# Patient Record
Sex: Male | Born: 1955 | Race: White | Hispanic: No | Marital: Married | Smoking: Never smoker
Health system: Southern US, Community
[De-identification: ages and names within clinical notes are randomized; demographics above are authoritative.]

## PROBLEM LIST (undated history)

## (undated) DIAGNOSIS — K353 Acute appendicitis with localized peritonitis, without perforation or gangrene: Secondary | ICD-10-CM

## (undated) DIAGNOSIS — K648 Other hemorrhoids: Secondary | ICD-10-CM

## (undated) DIAGNOSIS — K625 Hemorrhage of anus and rectum: Secondary | ICD-10-CM

## (undated) DIAGNOSIS — K921 Melena: Secondary | ICD-10-CM

## (undated) DIAGNOSIS — E785 Hyperlipidemia, unspecified: Secondary | ICD-10-CM

## (undated) DIAGNOSIS — T7840XA Allergy, unspecified, initial encounter: Secondary | ICD-10-CM

## (undated) HISTORY — DX: Hemorrhage of anus and rectum: K62.5

## (undated) HISTORY — DX: Hyperlipidemia, unspecified: E78.5

## (undated) HISTORY — DX: Melena: K92.1

## (undated) HISTORY — DX: Acute appendicitis with localized peritonitis, without perforation or gangrene: K35.30

## (undated) HISTORY — DX: Other hemorrhoids: K64.8

## (undated) HISTORY — DX: Allergy, unspecified, initial encounter: T78.40XA

---

## 2004-11-01 ENCOUNTER — Ambulatory Visit: Payer: Self-pay | Admitting: Internal Medicine

## 2005-10-16 ENCOUNTER — Ambulatory Visit: Payer: Self-pay | Admitting: Internal Medicine

## 2006-09-09 ENCOUNTER — Ambulatory Visit: Payer: Self-pay | Admitting: Internal Medicine

## 2006-10-14 ENCOUNTER — Ambulatory Visit: Payer: Self-pay | Admitting: Internal Medicine

## 2010-09-21 NOTE — Assessment & Plan Note (Signed)
[facility]                         GASTROENTEROLOGY OFFICE NOTE   NAME:Davtyan, NICKSON MIDDLESWORTH                     MRN:          [id number]  DATE:09/09/2006                            DOB:          [birth year]    CHIEF COMPLAINT:  Hemorrhoids, follow up polyp.   Mr. Cubbage returns, last seen October 16, 2005.  His problems are:  1. Prolapsing, recurrent hemorrhoids, I think grade III.  2. Adenomatous colon polyp of the appendix, removed with cold biopsy      forceps versus biopsied (unsure of complete removal).  3. Allergies.   MEDICATIONS:  Claritin and Advair p.r.n.   DRUG ALLERGIES:  None known.   He is still having occasional problems with hemorrhoids, but he is  trying to modify diet and keep his bowels regular and not strain.  There  is some bleeding and protrusion and prolapse at times.  He is not really  using hydrocortisone suppositories any more.   He is not a smoker.  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   PHYSICAL EXAMINATION:  VITAL SIGNS:  Weight 176 pounds, pulse 80, blood  pressure 100/60.  RECTAL:  Inspection of the anal area shows some skin tag-like lesions  consistent with old external hemorrhoids, but there are also some mildly  prolapsed hemorrhoids protruding from the anal canal consistent with  prolapsing internal hemorrhoids.  Digital exam is not performed, and I  did not try to reduce these.  They appear to be grade III hemorrhoids at  this point given the prolapse, and that is consistent with previous  exams where I think it was worse, actually.   ASSESSMENT AND PLAN:  1. History of colon polyps.  A surveillance colonoscopy is indicated.      He is a little overdue from the 3 years I recommended, but I wanted      to bring him back at that timetable because I was not sure that I      completely removed the polyp.  In fact, I thought it was probably a      lymphoid follicle at the time.  2. Schedule surgical  appointment for evaluation of his hemorrhoids.      We have talked about the various methods, including my ability to      provide band ligation, but I think given the problems he has had, I      am not convinced that would be a durable procedure.  I would like      him to see a surgeon who has more options at his disposal with      respect to hemorrhoids,      and I really think a PPH procedure might be in his best interest      but would defer to the patient and surgical opinion.  I will ask      him to see Timothy E. Earlene Plater, M.D.     Iva Boop, MD,FACG  Electronically Signed    CEG/MedQ  DD: 09/09/2006  DT: 09/10/2006  Job #: [id number]   cc:   Mosetta Putt,  M.D.  Sheppard Plumber. Earlene Plater, M.D.

## 2011-06-25 ENCOUNTER — Ambulatory Visit (INDEPENDENT_AMBULATORY_CARE_PROVIDER_SITE_OTHER): Payer: Self-pay | Admitting: General Surgery

## 2011-06-25 ENCOUNTER — Encounter (INDEPENDENT_AMBULATORY_CARE_PROVIDER_SITE_OTHER): Payer: Self-pay | Admitting: General Surgery

## 2011-06-25 VITALS — BP 110/70 | HR 60 | Temp 98.4°F | Ht 71.0 in | Wt 171.6 lb

## 2011-06-25 DIAGNOSIS — K648 Other hemorrhoids: Secondary | ICD-10-CM

## 2011-06-25 NOTE — Patient Instructions (Signed)
Avoid straining if you can.  Go to the emergency room if you have heavy bleeding.

## 2011-06-25 NOTE — Progress Notes (Signed)
Patient ID: Brett Pace, male   DOB: 1955-10-17, 56 y.o.   MRN: [id number]  Chief Complaint  Patient presents with  . Rectal Problems    eval hems    HPI Brett Pace is a 56 y.o. male.   HPI  He is self-referred. He has a history of prolapsing internal hemorrhoids. He underwent rubber band ligation by Dr. Earlene Plater many years ago. He does a lot of heavy lifting at work. Recently he has had recurrent prolapse and some rectal bleeding. He feels it is from his internal hemorrhoids and presents here for evaluation.  Past Medical History  Diagnosis Date  . Asthma   . Rectal bleeding   . Blood in stool   . Prolapsed hemorrhoids     History reviewed. No pertinent past surgical history.  History reviewed. No pertinent family history.  Social History History  Substance Use Topics  . Smoking status: Never Smoker   . Smokeless tobacco: Not on file  . Alcohol Use: No    No Known Allergies  No current outpatient prescriptions on file.    Review of Systems Review of Systems  Constitutional: Negative.   Gastrointestinal: Negative for abdominal distention.       Per hpi.    Blood pressure 110/70, pulse 60, temperature 98.4 F (36.9 C), temperature source Temporal, height 5\' 11"  (1.803 m), weight 171 lb 9.6 oz (77.837 kg), SpO2 98.00%.  Physical Exam Physical Exam  Constitutional: He appears well-developed and well-nourished. No distress.  Genitourinary:       External hemorrhoids noted right anterior, right posterior, left lateral. These are soft and nontender.  Prolapsing internal hemorrhoid noted right anterior. This appears inflamed and friable.  Digital rectal exam no anal masses or blood.  Anoscopy-swollen inflamed right anterior internal hemorrhoid. Rubber band ligation this was performed and he tolerated it well.    Data Reviewed none  Assessment    Bleeding prolapsing internal hemorrhoid right anterior area. Rubber band ligation was performed.    Plan    Avoid heavy lifting or straining as much as possible. Wear a pad in his underwear. If he has heavy bleeding from the area go directly to the emergency department. Return visit 2 weeks.       Jianni Batten J 06/25/2011, 5:40 PM

## 2011-06-28 ENCOUNTER — Telehealth (INDEPENDENT_AMBULATORY_CARE_PROVIDER_SITE_OTHER): Payer: Self-pay

## 2011-06-28 NOTE — Telephone Encounter (Signed)
Pts wife calling to see if pt could take advil if needed for rectal pain. Pt had banding done 3 days ago and still has some mild rectal discomfort. Pt takes no meds and has not allergies. Pts wife advised pt can take advil and follow recommendations on bottle. To have pt call if discomfort does not resolve.

## 2011-09-02 ENCOUNTER — Encounter: Payer: Self-pay | Admitting: Internal Medicine

## 2012-03-19 ENCOUNTER — Ambulatory Visit (INDEPENDENT_AMBULATORY_CARE_PROVIDER_SITE_OTHER): Payer: Self-pay | Admitting: General Surgery

## 2012-03-19 ENCOUNTER — Encounter (INDEPENDENT_AMBULATORY_CARE_PROVIDER_SITE_OTHER): Payer: Self-pay | Admitting: General Surgery

## 2012-03-19 VITALS — BP 122/80 | HR 72 | Temp 96.9°F | Ht 71.0 in | Wt 172.0 lb

## 2012-03-19 DIAGNOSIS — K648 Other hemorrhoids: Secondary | ICD-10-CM

## 2012-03-19 MED ORDER — PRAMOXINE HCL 1 % RE FOAM: RECTAL | Status: DC | PRN

## 2012-03-19 NOTE — Progress Notes (Signed)
Subjective:     Patient ID: Brett Pace, male   DOB: 01/29/1956, 56 y.o.   MRN: [id number]  HPI  He is here because he is having some rectal bleeding again. He did well after the rubber band treatment in February of this year. About 3 weeks ago he began having intermittent rectal bleeding. He's also had some very loose frequent BMs. He is here to have this evaluated.   Review of Systems     Objective:   Physical Exam Gen.-he looks well and is in no acute distress.  Anorectal-small external hemorrhoids are noted. No masses on digital rectal exam. Anoscopy demonstrates moderate size internal hemorrhoids right anterior and right posterior positions. I attempted rubber band ligation of the right anterior hemorrhoid but could not get the rubber band to stay on the hemorrhoid. He was uncomfortable at this time so the procedure was aborted.    Assessment:     Bleeding internal hemorrhoids-rubber band ligation unable to be done.    Plan:     Proctofoam. If after a month He is still having bleeding problems then I asked him to call back for a follow-up visit.

## 2012-03-19 NOTE — Patient Instructions (Signed)
Use the medication as directed.

## 2012-08-05 ENCOUNTER — Encounter: Payer: Self-pay | Admitting: Internal Medicine

## 2013-02-03 ENCOUNTER — Encounter (INDEPENDENT_AMBULATORY_CARE_PROVIDER_SITE_OTHER): Payer: Self-pay | Admitting: General Surgery

## 2013-02-03 ENCOUNTER — Ambulatory Visit (INDEPENDENT_AMBULATORY_CARE_PROVIDER_SITE_OTHER): Payer: Self-pay | Admitting: General Surgery

## 2013-02-03 VITALS — BP 110/68 | HR 66 | Temp 97.3°F | Resp 14 | Ht 71.0 in | Wt 167.2 lb

## 2013-02-03 DIAGNOSIS — K644 Residual hemorrhoidal skin tags: Secondary | ICD-10-CM

## 2013-02-03 MED ORDER — HYDROCORTISONE 2.5 % RE CREA: TOPICAL_CREAM | Freq: Two times a day (BID) | RECTAL | Status: DC

## 2013-02-03 NOTE — Progress Notes (Signed)
Subjective:     Patient ID: Brett Pace, male   DOB: 01/11/1956, 57 y.o.   MRN: [id number]  HPI  He is well known to me because of his problems with bleeding internal hemorrhoids. This is better. However, he complains of having some anal discomfort after having a bowel movement especially after he is constipated. Certain foods make him constipated. By the next morning he is feeling better. This does not occur with every bowel movement.  No bleeding.   Review of Systems     Objective:   Physical Exam Gen.-he looks well and is in no acute distress.  Anorectal-external swellings are present and are of moderate size in the right anterior and right posterior positions. Small external swelling in the left lateral position. No fissure. Digital rectal exam no masses are felt.  Anoscopy demonstrates moderate size internal hemorrhoids in the right anterior and posterior positions with smaller internal hemorrhoid in the left lateral position.    Assessment:     Symptomatic external hemorrhoids. We discussed options including medical treatment, change in diet, versus surgical treatment. He prefers to try to medical treatment.     Plan:     We'll give him some Anusol HC cream to apply to the external hemorrhoids as needed. I've instructed him to increase the fiber in his diet by way of increasing raw fruits, raw vegetables, and grains. Return visit if this does not help him.

## 2013-02-03 NOTE — Patient Instructions (Addendum)
Increase the amount of raw fruits, raw vegetables, and grains in your diet.  Avoid constipation and take Milk of Magnesia or Miralax if you are having problems with constipation.  Use the hemorrhoid cream as needed.  Return if these things do not help.

## 2014-12-14 ENCOUNTER — Encounter: Payer: Self-pay | Admitting: Internal Medicine

## 2016-07-06 ENCOUNTER — Encounter (HOSPITAL_COMMUNITY)
Admission: EM | Disposition: A | Discharge status: Home / Self Care | Payer: Self-pay | Source: Home / Self Care | Attending: Emergency Medicine

## 2016-07-06 ENCOUNTER — Emergency Department (HOSPITAL_COMMUNITY): Payer: Self-pay | Admitting: Anesthesiology

## 2016-07-06 ENCOUNTER — Observation Stay (HOSPITAL_COMMUNITY)
Admission: EM | Admit: 2016-07-06 | Discharge: 2016-07-07 | Disposition: A | Discharge status: Home / Self Care | Payer: Self-pay | Attending: General Surgery | Admitting: General Surgery

## 2016-07-06 ENCOUNTER — Emergency Department (HOSPITAL_COMMUNITY): Payer: Self-pay

## 2016-07-06 ENCOUNTER — Encounter (HOSPITAL_COMMUNITY): Payer: Self-pay | Admitting: *Deleted

## 2016-07-06 DIAGNOSIS — K3580 Unspecified acute appendicitis: Principal | ICD-10-CM | POA: Diagnosis present

## 2016-07-06 DIAGNOSIS — K353 Acute appendicitis with localized peritonitis, without perforation or gangrene: Secondary | ICD-10-CM

## 2016-07-06 HISTORY — PX: LAPAROSCOPIC APPENDECTOMY: SHX408

## 2016-07-06 LAB — URINALYSIS, ROUTINE W REFLEX MICROSCOPIC
BILIRUBIN URINE: NEGATIVE
GLUCOSE, UA: NEGATIVE mg/dL
HGB URINE DIPSTICK: NEGATIVE
KETONES UR: 20 mg/dL — AB
Leukocytes, UA: NEGATIVE
Nitrite: NEGATIVE
PH: 7 (ref 5.0–8.0)
Protein, ur: NEGATIVE mg/dL
Specific Gravity, Urine: 1.023 (ref 1.005–1.030)

## 2016-07-06 LAB — COMPREHENSIVE METABOLIC PANEL
ALBUMIN: 4.1 g/dL (ref 3.5–5.0)
ALK PHOS: 92 U/L (ref 38–126)
ALT: 17 U/L (ref 17–63)
AST: 18 U/L (ref 15–41)
Anion gap: 9 (ref 5–15)
BILIRUBIN TOTAL: 1.1 mg/dL (ref 0.3–1.2)
BUN: 14 mg/dL (ref 6–20)
CO2: 25 mmol/L (ref 22–32)
Calcium: 8.9 mg/dL (ref 8.9–10.3)
Chloride: 101 mmol/L (ref 101–111)
Creatinine, Ser: 0.94 mg/dL (ref 0.61–1.24)
GFR calc Af Amer: >60 mL/min (ref >60)
GFR calc non Af Amer: >60 mL/min (ref >60)
GLUCOSE: 130 mg/dL — AB (ref 65–99)
POTASSIUM: 3.9 mmol/L (ref 3.5–5.1)
Sodium: 135 mmol/L (ref 135–145)
TOTAL PROTEIN: 7.3 g/dL (ref 6.5–8.1)

## 2016-07-06 LAB — CBC
HEMATOCRIT: 45.6 % (ref 39.0–52.0)
Hemoglobin: 15.6 g/dL (ref 13.0–17.0)
MCH: 30.3 pg (ref 26.0–34.0)
MCHC: 34.2 g/dL (ref 30.0–36.0)
MCV: 88.5 fL (ref 78.0–100.0)
Platelets: 219 10*3/uL (ref 150–400)
RBC: 5.15 MIL/uL (ref 4.22–5.81)
RDW: 12.4 % (ref 11.5–15.5)
WBC: 17.5 10*3/uL — ABNORMAL HIGH (ref 4.0–10.5)

## 2016-07-06 LAB — I-STAT TROPONIN, ED: TROPONIN I, POC: 0 ng/mL (ref 0.00–0.08)

## 2016-07-06 LAB — LIPASE, BLOOD: Lipase: 11 U/L (ref 11–51)

## 2016-07-06 SURGERY — APPENDECTOMY, LAPAROSCOPIC
Anesthesia: General

## 2016-07-06 MED ORDER — SUCCINYLCHOLINE CHLORIDE 20 MG/ML IJ SOLN
INTRAMUSCULAR | Status: AC
  Filled 2016-07-06: qty 1

## 2016-07-06 MED ORDER — GLYCOPYRROLATE 0.2 MG/ML IJ SOLN
INTRAMUSCULAR | Status: AC
  Filled 2016-07-06: qty 3

## 2016-07-06 MED ORDER — BUPIVACAINE HCL (PF) 0.5 % IJ SOLN
INTRAMUSCULAR | Status: AC
  Filled 2016-07-06: qty 30

## 2016-07-06 MED ORDER — SODIUM CHLORIDE 0.9 % IJ SOLN
INTRAMUSCULAR | Status: AC
  Filled 2016-07-06: qty 10

## 2016-07-06 MED ORDER — ERTAPENEM SODIUM 1 G IJ SOLR
1.0000 g | INTRAMUSCULAR | Status: DC
  Administered 2016-07-07: 1 g via INTRAVENOUS
  Filled 2016-07-06 (×2): qty 1

## 2016-07-06 MED ORDER — CHLORHEXIDINE GLUCONATE CLOTH 2 % EX PADS: 6.0000 | MEDICATED_PAD | Freq: Once | CUTANEOUS | Status: DC

## 2016-07-06 MED ORDER — NEOSTIGMINE METHYLSULFATE 10 MG/10ML IV SOLN
INTRAVENOUS | Status: DC | PRN
  Administered 2016-07-06: 4 mg via INTRAVENOUS

## 2016-07-06 MED ORDER — SIMETHICONE 80 MG PO CHEW: 40.0000 mg | CHEWABLE_TABLET | Freq: Four times a day (QID) | ORAL | Status: DC | PRN

## 2016-07-06 MED ORDER — LIDOCAINE HCL (CARDIAC) 10 MG/ML IV SOLN
INTRAVENOUS | Status: DC | PRN
  Administered 2016-07-06: 50 mg via INTRAVENOUS

## 2016-07-06 MED ORDER — FENTANYL CITRATE (PF) 250 MCG/5ML IJ SOLN
INTRAMUSCULAR | Status: AC
  Filled 2016-07-06: qty 5

## 2016-07-06 MED ORDER — SODIUM CHLORIDE 0.9 % IR SOLN
Status: DC | PRN
  Administered 2016-07-06: 1000 mL

## 2016-07-06 MED ORDER — MIDAZOLAM HCL 5 MG/5ML IJ SOLN
INTRAMUSCULAR | Status: DC | PRN
Start: 2016-07-06 — End: 2016-07-06
  Administered 2016-07-06: 2 mg via INTRAVENOUS

## 2016-07-06 MED ORDER — MORPHINE SULFATE (PF) 4 MG/ML IV SOLN
4.0000 mg | Freq: Once | INTRAVENOUS | Status: DC
  Filled 2016-07-06: qty 1

## 2016-07-06 MED ORDER — ROCURONIUM BROMIDE 50 MG/5ML IV SOLN
INTRAVENOUS | Status: AC
  Filled 2016-07-06: qty 1

## 2016-07-06 MED ORDER — BUPIVACAINE HCL (PF) 0.5 % IJ SOLN
INTRAMUSCULAR | Status: DC | PRN
  Administered 2016-07-06: 10 mL

## 2016-07-06 MED ORDER — ONDANSETRON 4 MG PO TBDP: 4.0000 mg | ORAL_TABLET | Freq: Four times a day (QID) | ORAL | Status: DC | PRN

## 2016-07-06 MED ORDER — PROPOFOL 10 MG/ML IV BOLUS
INTRAVENOUS | Status: AC
  Filled 2016-07-06: qty 40

## 2016-07-06 MED ORDER — POVIDONE-IODINE 10 % EX OINT
TOPICAL_OINTMENT | CUTANEOUS | Status: AC
  Filled 2016-07-06: qty 1

## 2016-07-06 MED ORDER — ONDANSETRON HCL 4 MG/2ML IJ SOLN
4.0000 mg | Freq: Once | INTRAMUSCULAR | Status: DC
  Filled 2016-07-06: qty 2

## 2016-07-06 MED ORDER — SODIUM CHLORIDE 0.9 % IV SOLN
INTRAVENOUS | Status: DC | PRN
  Administered 2016-07-06: 15:00:00 via INTRAVENOUS

## 2016-07-06 MED ORDER — SODIUM CHLORIDE 0.9 % IV SOLN
INTRAVENOUS | Status: DC
  Administered 2016-07-06: 23:00:00 via INTRAVENOUS

## 2016-07-06 MED ORDER — MIDAZOLAM HCL 2 MG/2ML IJ SOLN
INTRAMUSCULAR | Status: AC
  Filled 2016-07-06: qty 2

## 2016-07-06 MED ORDER — GLYCOPYRROLATE 0.2 MG/ML IJ SOLN
INTRAMUSCULAR | Status: DC | PRN
  Administered 2016-07-06: 0.6 mg via INTRAVENOUS

## 2016-07-06 MED ORDER — OXYCODONE-ACETAMINOPHEN 5-325 MG PO TABS
1.0000 | ORAL_TABLET | ORAL | Status: DC | PRN
  Administered 2016-07-07 (×2): 2 via ORAL
  Filled 2016-07-06 (×2): qty 2

## 2016-07-06 MED ORDER — GLYCOPYRROLATE 0.2 MG/ML IJ SOLN
INTRAMUSCULAR | Status: AC
  Filled 2016-07-06: qty 1

## 2016-07-06 MED ORDER — DIPHENHYDRAMINE HCL 25 MG PO CAPS: 25.0000 mg | ORAL_CAPSULE | Freq: Four times a day (QID) | ORAL | Status: DC | PRN

## 2016-07-06 MED ORDER — LIDOCAINE HCL (PF) 1 % IJ SOLN
INTRAMUSCULAR | Status: AC
  Filled 2016-07-06: qty 5

## 2016-07-06 MED ORDER — ARTIFICIAL TEARS OP OINT
TOPICAL_OINTMENT | OPHTHALMIC | Status: AC
  Filled 2016-07-06: qty 3.5

## 2016-07-06 MED ORDER — ERTAPENEM SODIUM 1 G IJ SOLR
1.0000 g | Freq: Once | INTRAMUSCULAR | Status: AC
  Administered 2016-07-06: 1 g via INTRAVENOUS
  Filled 2016-07-06: qty 1

## 2016-07-06 MED ORDER — IOPAMIDOL (ISOVUE-300) INJECTION 61%
100.0000 mL | Freq: Once | INTRAVENOUS | Status: AC | PRN
  Administered 2016-07-06: 100 mL via INTRAVENOUS

## 2016-07-06 MED ORDER — POVIDONE-IODINE 10 % OINT PACKET
TOPICAL_OINTMENT | CUTANEOUS | Status: DC | PRN
  Administered 2016-07-06: 1 via TOPICAL

## 2016-07-06 MED ORDER — ONDANSETRON HCL 4 MG/2ML IJ SOLN: 4.0000 mg | Freq: Four times a day (QID) | INTRAMUSCULAR | Status: DC | PRN

## 2016-07-06 MED ORDER — ENOXAPARIN SODIUM 40 MG/0.4ML ~~LOC~~ SOLN
40.0000 mg | SUBCUTANEOUS | Status: DC
  Administered 2016-07-07: 40 mg via SUBCUTANEOUS
  Filled 2016-07-06: qty 0.4

## 2016-07-06 MED ORDER — PROPOFOL 10 MG/ML IV BOLUS
INTRAVENOUS | Status: DC | PRN
  Administered 2016-07-06: 150 mg via INTRAVENOUS

## 2016-07-06 MED ORDER — DIPHENHYDRAMINE HCL 50 MG/ML IJ SOLN: 25.0000 mg | Freq: Four times a day (QID) | INTRAMUSCULAR | Status: DC | PRN

## 2016-07-06 MED ORDER — PHENYLEPHRINE 40 MCG/ML (10ML) SYRINGE FOR IV PUSH (FOR BLOOD PRESSURE SUPPORT)
PREFILLED_SYRINGE | INTRAVENOUS | Status: AC
  Filled 2016-07-06: qty 10

## 2016-07-06 MED ORDER — SUCCINYLCHOLINE CHLORIDE 20 MG/ML IJ SOLN
INTRAMUSCULAR | Status: DC | PRN
  Administered 2016-07-06: 120 mg via INTRAVENOUS

## 2016-07-06 MED ORDER — SODIUM CHLORIDE 0.9 % IV BOLUS (SEPSIS)
1000.0000 mL | Freq: Once | INTRAVENOUS | Status: AC
  Administered 2016-07-06: 1000 mL via INTRAVENOUS

## 2016-07-06 MED ORDER — KETOROLAC TROMETHAMINE 30 MG/ML IJ SOLN
30.0000 mg | Freq: Once | INTRAMUSCULAR | Status: AC
Start: 2016-07-06 — End: 2016-07-06
  Administered 2016-07-06: 30 mg via INTRAVENOUS
  Filled 2016-07-06: qty 1

## 2016-07-06 MED ORDER — EPHEDRINE SULFATE 50 MG/ML IJ SOLN
INTRAMUSCULAR | Status: AC
  Filled 2016-07-06: qty 1

## 2016-07-06 MED ORDER — LORAZEPAM 2 MG/ML IJ SOLN: 1.0000 mg | INTRAMUSCULAR | Status: DC | PRN

## 2016-07-06 MED ORDER — NEOSTIGMINE METHYLSULFATE 10 MG/10ML IV SOLN
INTRAVENOUS | Status: AC
Start: 2016-07-06 — End: 2016-07-06
  Filled 2016-07-06: qty 1

## 2016-07-06 MED ORDER — ACETAMINOPHEN 650 MG RE SUPP: 650.0000 mg | Freq: Four times a day (QID) | RECTAL | Status: DC | PRN

## 2016-07-06 MED ORDER — SEVOFLURANE IN SOLN
RESPIRATORY_TRACT | Status: AC
  Filled 2016-07-06: qty 250

## 2016-07-06 MED ORDER — SODIUM CHLORIDE 0.9 % IJ SOLN
INTRAMUSCULAR | Status: AC
  Filled 2016-07-06: qty 20

## 2016-07-06 MED ORDER — ROCURONIUM BROMIDE 100 MG/10ML IV SOLN
INTRAVENOUS | Status: DC | PRN
  Administered 2016-07-06 (×2): 10 mg via INTRAVENOUS

## 2016-07-06 MED ORDER — ONDANSETRON HCL 4 MG/2ML IJ SOLN
INTRAMUSCULAR | Status: AC
  Filled 2016-07-06: qty 2

## 2016-07-06 MED ORDER — FENTANYL CITRATE (PF) 100 MCG/2ML IJ SOLN
INTRAMUSCULAR | Status: DC | PRN
  Administered 2016-07-06 (×3): 50 ug via INTRAVENOUS

## 2016-07-06 MED ORDER — LIDOCAINE HCL (PF) 1 % IJ SOLN
INTRAMUSCULAR | Status: AC
  Filled 2016-07-06: qty 10

## 2016-07-06 MED ORDER — HYDROMORPHONE HCL 1 MG/ML IJ SOLN
1.0000 mg | INTRAMUSCULAR | Status: DC | PRN
  Administered 2016-07-07: 1 mg via INTRAVENOUS
  Filled 2016-07-06 (×2): qty 1

## 2016-07-06 MED ORDER — ACETAMINOPHEN 325 MG PO TABS: 650.0000 mg | ORAL_TABLET | Freq: Four times a day (QID) | ORAL | Status: DC | PRN

## 2016-07-06 SURGICAL SUPPLY — 43 items
BAG HAMPER (MISCELLANEOUS) ×3 IMPLANT
CHLORAPREP W/TINT 26ML (MISCELLANEOUS) ×3 IMPLANT
CLOTH BEACON ORANGE TIMEOUT ST (SAFETY) ×3 IMPLANT
COVER LIGHT HANDLE STERIS (MISCELLANEOUS) ×6 IMPLANT
CUTTER FLEX LINEAR 45M (STAPLE) ×3 IMPLANT
DECANTER SPIKE VIAL GLASS SM (MISCELLANEOUS) ×3 IMPLANT
ELECT REM PT RETURN 9FT ADLT (ELECTROSURGICAL) ×3
ELECTRODE REM PT RTRN 9FT ADLT (ELECTROSURGICAL) ×1 IMPLANT
EVACUATOR SMOKE 8.L (FILTER) ×3 IMPLANT
FORMALIN 10 PREFIL 120ML (MISCELLANEOUS) ×3 IMPLANT
GLOVE BIOGEL PI IND STRL 7.0 (GLOVE) ×2 IMPLANT
GLOVE BIOGEL PI INDICATOR 7.0 (GLOVE) ×4
GLOVE SURG SS PI 7.5 STRL IVOR (GLOVE) ×3 IMPLANT
GOWN STRL REUS W/ TWL XL LVL3 (GOWN DISPOSABLE) ×1 IMPLANT
GOWN STRL REUS W/TWL LRG LVL3 (GOWN DISPOSABLE) ×3 IMPLANT
GOWN STRL REUS W/TWL XL LVL3 (GOWN DISPOSABLE) ×2
INST SET LAPROSCOPIC AP (KITS) ×3 IMPLANT
IV NS IRRIG 3000ML ARTHROMATIC (IV SOLUTION) IMPLANT
KIT ROOM TURNOVER APOR (KITS) ×3 IMPLANT
MANIFOLD NEPTUNE II (INSTRUMENTS) ×3 IMPLANT
NEEDLE INSUFFLATION 14GA 120MM (NEEDLE) ×3 IMPLANT
NS IRRIG 1000ML POUR BTL (IV SOLUTION) ×3 IMPLANT
PACK LAP CHOLE LZT030E (CUSTOM PROCEDURE TRAY) ×3 IMPLANT
PAD ARMBOARD 7.5X6 YLW CONV (MISCELLANEOUS) ×3 IMPLANT
PENCIL HANDSWITCHING (ELECTRODE) ×3 IMPLANT
POUCH SPECIMEN RETRIEVAL 10MM (ENDOMECHANICALS) ×3 IMPLANT
RELOAD 45 VASCULAR/THIN (ENDOMECHANICALS) IMPLANT
RELOAD STAPLE TA45 3.5 REG BLU (ENDOMECHANICALS) IMPLANT
SET BASIN LINEN APH (SET/KITS/TRAYS/PACK) ×3 IMPLANT
SET TUBE IRRIG SUCTION NO TIP (IRRIGATION / IRRIGATOR) IMPLANT
SHEARS HARMONIC ACE PLUS 36CM (ENDOMECHANICALS) ×3 IMPLANT
SPONGE GAUZE 2X2 8PLY STER LF (GAUZE/BANDAGES/DRESSINGS) ×1
SPONGE GAUZE 2X2 8PLY STRL LF (GAUZE/BANDAGES/DRESSINGS) ×2 IMPLANT
STAPLER VISISTAT (STAPLE) ×3 IMPLANT
SUT VICRYL 0 UR6 27IN ABS (SUTURE) ×3 IMPLANT
TAPE CLOTH SURG 4X10 WHT LF (GAUZE/BANDAGES/DRESSINGS) ×3 IMPLANT
TRAY FOLEY CATH SILVER 16FR (SET/KITS/TRAYS/PACK) ×3 IMPLANT
TROCAR ENDO BLADELESS 11MM (ENDOMECHANICALS) ×3 IMPLANT
TROCAR ENDO BLADELESS 12MM (ENDOMECHANICALS) ×3 IMPLANT
TROCAR XCEL NON-BLD 5MMX100MML (ENDOMECHANICALS) ×3 IMPLANT
TUBING INSUFFLATION (TUBING) ×3 IMPLANT
WARMER LAPAROSCOPE (MISCELLANEOUS) ×3 IMPLANT
YANKAUER SUCT 12FT TUBE ARGYLE (SUCTIONS) ×3 IMPLANT

## 2016-07-06 NOTE — ED Notes (Signed)
Pt and family aware of NPO status.

## 2016-07-06 NOTE — Addendum Note (Signed)
Addendum  created 07/06/16 1715 by Earleen NewportAmy A Robbi Spells, CRNA   Anesthesia Intra Meds edited

## 2016-07-06 NOTE — Anesthesia Postprocedure Evaluation (Signed)
Anesthesia Post Note  Patient: Sharlene DoryHenry J Winsett  Procedure(s) Performed: Procedure(s) (LRB): APPENDECTOMY LAPAROSCOPIC (N/A)  Patient location during evaluation: PACU Anesthesia Type: General Level of consciousness: awake and alert and oriented Pain management: pain level controlled Vital Signs Assessment: post-procedure vital signs reviewed and stable Respiratory status: spontaneous breathing Cardiovascular status: stable Postop Assessment: no signs of nausea or vomiting Anesthetic complications: no     Last Vitals:  Vitals:   07/06/16 1645 07/06/16 1700  BP: 107/78 123/77  Pulse: 95   Resp: 19 17  Temp: 37 C     Last Pain:  Vitals:   07/06/16 1003  TempSrc:   PainSc: 2                  ADAMS, AMY A

## 2016-07-06 NOTE — Op Note (Signed)
Patient:  Brett Pace  DOB:  29-Jun-1955  MRN:  [id number]   Preop Diagnosis:  Acute appendicitis  Postop Diagnosis:  Same  Procedure:  Laparoscopic appendectomy  Surgeon:  Franky MachoMark Tommye Lehenbauer, M.D.  Anes:  Gen. endotracheal  Indications:  Patient is a 61 year old white male who presents with a 24-hour history of worsening right lower quadrant abdominal pain. CT scan of the abdomen reveals acute appendicitis. The risks and benefits of the procedure including bleeding, infection, and the possibility of an open procedure were fully explained to the patient, who gave informed consent.  Procedure note:  The patient was placed in the supine position. After induction of general endotracheal anesthesia, the abdomen was prepped and draped using usual sterile technique with DuraPrep. Surgical site confirmation was performed.  A supraumbilical incision was made down to the fascia. A Veress needle was introduced into the abdominal cavity and confirmation of placement was done using the saline drop test. The abdomen was then insufflated to 16 mmHg pressure. An 11 mm trocar was introduced into the abdominal cavity under direct visualization without difficulty. The patient was placed in deeper Trendelenburg position and an additional 12 mm trocar was placed in the suprapubic region and a 5 mm trocar was placed in the left lower quadrant region. The appendix was visualized and noted to be acutely inflamed. The mesoappendix was divided using the Harmonic scalpel. A vascular Endo GIA was placed across the base the appendix and fired. The appendix was then removed using an Endo Catch bag. The staple line was inspected and noted to be within normal limits. All fluid and air were then evacuated from the abdominal cavity prior to the removal of the trochars.  All wounds were irrigated with normal saline. All wounds were injected with 0.5% Sensorcaine. The supraumbilical fascia as well as suprapubic fascia were  reapproximated using 0 Vicryl interrupted sutures. All skin incisions were closed using staples. Betadine ointment and dry sterile dressings were applied.  All tape and needle counts were correct at the end the procedure. The patient was extubated in the operating room and transferred to PACU in stable condition.  Complications:  None  EBL:  Minimal  Specimen:  Appendix

## 2016-07-06 NOTE — Anesthesia Procedure Notes (Signed)
Procedure Name: Intubation Date/Time: 07/06/2016 3:52 PM Performed by: Pernell DupreADAMS, AMY A Pre-anesthesia Checklist: Patient identified, Patient being monitored, Timeout performed, Emergency Drugs available and Suction available Patient Re-evaluated:Patient Re-evaluated prior to inductionOxygen Delivery Method: Circle System Utilized Preoxygenation: Pre-oxygenation with 100% oxygen Intubation Type: IV induction, Rapid sequence and Cricoid Pressure applied Ventilation: Mask ventilation without difficulty Laryngoscope Size: Miller and 3 Grade View: Grade I Tube type: Oral Tube size: 7.0 mm Number of attempts: 1 Airway Equipment and Method: Stylet Placement Confirmation: ETT inserted through vocal cords under direct vision,  positive ETCO2 and breath sounds checked- equal and bilateral Secured at: 21 cm Tube secured with: Tape Dental Injury: Teeth and Oropharynx as per pre-operative assessment

## 2016-07-06 NOTE — ED Provider Notes (Signed)
[facility]-EMERGENCY DEPT Provider Note   CSN: [id number] Arrival date & time: 07/06/16  [id number]     History   Chief Complaint Chief Complaint  Patient presents with  . Abdominal Pain    HPI Brett Pace is a 61 y.o. male.  61 year old man with history of GERD, esophageal spasm, IBS-D, and asthma presents with abdominal pain. Abdominal pain started yesterday at noon as a pressure like sensation in the center of his abdomen that radiates to his epigastrium. The pressure sensation is worse when he sits up or moves around. Severity is 6 out of 10. The pain was initially only associated with belching and decreased appetite. His morning he woke up and felt lightheaded when he sat up in bed and developed shakiness, cold sweats and diarrhea. When he arrived to the ED he became nauseous and had nonbloody nonbilious vomited x1. This morning he also had burning with urination. He has history of IBS but diarrhea usually works relieve the pain, that did not happen this morning. He denies myalgia, difficulty breathing, chest pain or pressure, edema, heartburn or metallic taste. At baseline he can walk a mile without developing chest pain or shortness of breath. His HR is usually ~68.       Past Medical History:  Diagnosis Date  . Asthma   . Blood in stool   . Prolapsed hemorrhoids   . Rectal bleeding     Patient Active Problem List   Diagnosis Date Noted  . External hemorrhoids 02/03/2013  . Bleeding internal hemorrhoids 03/19/2012  . Internal prolapsed hemorrhoids 06/25/2011    History reviewed. No pertinent surgical history.     Home Medications    Prior to Admission medications   Medication Sig Start Date End Date Taking? Authorizing Provider  hydrocortisone (ANUSOL-HC) 2.5 % rectal cream Place rectally 2 (two) times daily. Apply around anus for irritated & painful hemorrhoids 02/03/13   Avel Peaceodd Rosenbower, MD  pramoxine (PROCTOFOAM) 1 % foam Place rectally every 4 (four) hours as needed  for hemorrhoids. 03/19/12   Avel Peaceodd Rosenbower, MD    Family History Family History  Problem Relation Age of Onset  . Cancer Mother     ovarian    Social History Social History  Substance Use Topics  . Smoking status: Never Smoker  . Smokeless tobacco: Never Used  . Alcohol use No     Allergies   Patient has no known allergies.   Review of Systems Review of Systems  Constitutional: Positive for appetite change and diaphoresis. Negative for chills and fever.  HENT: Negative for congestion and sore throat.   Respiratory: Negative for chest tightness, shortness of breath and wheezing.   Cardiovascular: Negative for chest pain and leg swelling.  Gastrointestinal: Positive for abdominal pain, diarrhea, nausea and vomiting. Negative for blood in stool.  Genitourinary: Positive for dysuria. Negative for frequency and urgency.  Musculoskeletal: Negative for myalgias.  Neurological: Positive for dizziness.  All other systems reviewed and are negative.    Physical Exam Updated Vital Signs BP 103/66 (BP Location: Right Arm)   Pulse (!) 51   Temp 97.7 F (36.5 C) (Oral)   Resp 18   Ht 5\' 10"  (1.778 m)   Wt 77.1 kg   SpO2 100%   BMI 24.39 kg/m   Physical Exam  Constitutional: He appears well-developed and well-nourished. No distress.  HENT:  Head: Normocephalic and atraumatic.  Cardiovascular: Regular rhythm.  Bradycardia present.   No murmur heard. Pulmonary/Chest: Effort normal. No respiratory distress. He  has no wheezes. He has no rales. He exhibits no tenderness.  Abdominal: Soft. Bowel sounds are normal. He exhibits no distension. There is no guarding.  RLQ tenderness   Neurological: He is alert.  Skin: Skin is warm and dry. He is not diaphoretic.     ED Treatments / Results  Labs (all labs ordered are listed, but only abnormal results are displayed) Labs Reviewed  URINALYSIS, ROUTINE W REFLEX MICROSCOPIC    EKG  EKG Interpretation None        Radiology No results found.  Procedures Procedures (including critical care time)  Medications Ordered in ED Medications - No data to display   Initial Impression / Assessment and Plan / ED Course  I have reviewed the triage vital signs and the nursing notes.  Pertinent labs & imaging results that were available during my care of the patient were reviewed by me and considered in my medical decision making (see chart for details).   41-year-old man with history of IBS-D, GERD, esophogeal spasm, and asthma presented with abdominal pain, nausea, diarrhea, decreased appetite and rigors. He was afebrile but found to have right lower quadrant tenderness on exam. CBC revealed leukocytosis 17. Troponin was negative. Lipase and CMP were within normal limits. Urinalysis did not show signs of infection. CT abdomen and pelvis revealed inflammation of the appendix with no evidence of perforation or abscess. Orthopedic surgery was consulted for evaluation. He was started on IV antibiotics and admitted.   Final Clinical Impressions(s) / ED Diagnoses   Final diagnoses:  None    New Prescriptions New Prescriptions   No medications on file     Eulah Pont, MD 07/06/16 1422    Eulah Pont, MD 07/06/16 1506    Blane Ohara, MD 07/06/16 [phone]

## 2016-07-06 NOTE — H&P (Signed)
Brett Pace is an 61 y.o. male.   Chief Complaint: Right lower quadrant abdominal pain HPI: Patient is a 61 year old white male who presented to the emergency room with a 24-hour history of worsening lower abdominal pain. He states it was crampy in nature. He did feel somewhat shaky this morning. He did belch. No diarrhea or constipation. CT scan of the abdomen reveals acute appendicitis. His pain appears to be 4 out of 10.  Past Medical History:  Diagnosis Date  . Asthma   . Blood in stool   . Prolapsed hemorrhoids   . Rectal bleeding     History reviewed. No pertinent surgical history.  Family History  Problem Relation Age of Onset  . Cancer Mother     ovarian   Social History:  reports that he has never smoked. He has never used smokeless tobacco. He reports that he does not drink alcohol or use drugs.  Allergies: No Known Allergies   (Not in a hospital admission)  Results for orders placed or performed during the hospital encounter of 07/06/16 (from the past 48 hour(s))  Comprehensive metabolic panel     Status: Abnormal   Collection Time: 07/06/16  9:57 AM  Result Value Ref Range   Sodium 135 135 - 145 mmol/L   Potassium 3.9 3.5 - 5.1 mmol/L   Chloride 101 101 - 111 mmol/L   CO2 25 22 - 32 mmol/L   Glucose, Bld 130 (H) 65 - 99 mg/dL   BUN 14 6 - 20 mg/dL   Creatinine, Ser 0.94 0.61 - 1.24 mg/dL   Calcium 8.9 8.9 - 10.3 mg/dL   Total Protein 7.3 6.5 - 8.1 g/dL   Albumin 4.1 3.5 - 5.0 g/dL   AST 18 15 - 41 U/L   ALT 17 17 - 63 U/L   Alkaline Phosphatase 92 38 - 126 U/L   Total Bilirubin 1.1 0.3 - 1.2 mg/dL   GFR calc non Af Amer >60 >60 mL/min   GFR calc Af Amer >60 >60 mL/min    Comment: (NOTE) The eGFR has been calculated using the CKD EPI equation. This calculation has not been validated in all clinical situations. eGFR's persistently <60 mL/min signify possible Chronic Kidney Disease.    Anion gap 9 5 - 15  CBC     Status: Abnormal   Collection Time:  07/06/16  9:57 AM  Result Value Ref Range   WBC 17.5 (H) 4.0 - 10.5 K/uL   RBC 5.15 4.22 - 5.81 MIL/uL   Hemoglobin 15.6 13.0 - 17.0 g/dL   HCT 45.6 39.0 - 52.0 %   MCV 88.5 78.0 - 100.0 fL   MCH 30.3 26.0 - 34.0 pg   MCHC 34.2 30.0 - 36.0 g/dL   RDW 12.4 11.5 - 15.5 %   Platelets 219 150 - 400 K/uL  Lipase, blood     Status: None   Collection Time: 07/06/16  9:57 AM  Result Value Ref Range   Lipase 11 11 - 51 U/L  Urinalysis, Routine w reflex microscopic     Status: Abnormal   Collection Time: 07/06/16 10:24 AM  Result Value Ref Range   Color, Urine YELLOW YELLOW   APPearance CLEAR CLEAR   Specific Gravity, Urine 1.023 1.005 - 1.030   pH 7.0 5.0 - 8.0   Glucose, UA NEGATIVE NEGATIVE mg/dL   Hgb urine dipstick NEGATIVE NEGATIVE   Bilirubin Urine NEGATIVE NEGATIVE   Ketones, ur 20 (A) NEGATIVE mg/dL   Protein, ur  NEGATIVE NEGATIVE mg/dL   Nitrite NEGATIVE NEGATIVE   Leukocytes, UA NEGATIVE NEGATIVE  I-stat troponin, ED     Status: None   Collection Time: 07/06/16 10:26 AM  Result Value Ref Range   Troponin i, poc 0.00 0.00 - 0.08 ng/mL   Comment 3            Comment: Due to the release kinetics of cTnI, a negative result within the first hours of the onset of symptoms does not rule out myocardial infarction with certainty. If myocardial infarction is still suspected, repeat the test at appropriate intervals.    Ct Abdomen Pelvis W Contrast  Result Date: 07/06/2016 CLINICAL DATA:  Right abdominal pain with vomiting. EXAM: CT ABDOMEN AND PELVIS WITH CONTRAST TECHNIQUE: Multidetector CT imaging of the abdomen and pelvis was performed using the standard protocol following bolus administration of intravenous contrast. CONTRAST:  155m ISOVUE-300 IOPAMIDOL (ISOVUE-300) INJECTION 61% COMPARISON:  None. FINDINGS: Lower chest: 0.3 cm pleural-based nodule in the left lower lobe on sequence 4, image 16 is probably an incidental finding but nonspecific. No pleural effusions.  Hepatobiliary: 0.4 cm hypodensity in the left hepatic lobe is too small to definitively characterize. Otherwise, normal appearance of the liver or. Portal venous system is patent. Normal appearance of the gallbladder without biliary dilatation. Pancreas: Normal appearance of the pancreas without inflammation or duct dilatation. Spleen: Normal appearance of spleen without enlargement. Adrenals/Urinary Tract: Normal adrenal glands. Normal appearance of the urinary bladder. Normal appearance of both kidneys without hydronephrosis or suspicious renal lesion. Stomach/Bowel: Inflammation surrounding the appendix. Appendix is mildly dilated measuring up to 1.2 cm on sequence 2, image 69. There is no evidence for free air or adjacent abscess in this area. There may be mild wall thickening involving an adjacent loop of small bowel on sequence 2, image 73. No evidence for a bowel obstruction. Vascular/Lymphatic: No significant vascular findings are present. No enlarged abdominal or pelvic lymph nodes. Reproductive: Prostate is unremarkable. Other: No free fluid.  No free air. Musculoskeletal: S1 appears to represent a transitional vertebra ready. There is posterior disc space narrowing at L5-S1. IMPRESSION: Acute appendicitis. Inflammation surrounding the appendix but no evidence for perforation or abscess. Tiny pleural-based nodule in left lower lobe is nonspecific. No follow-up needed if patient is low-risk. Non-contrast chest CT can be considered in 12 months if patient is high-risk. This recommendation follows the consensus statement: Guidelines for Management of Incidental Pulmonary Nodules Detected on CT Images: From the Fleischner Society 2017; Radiology 2017; 284:228-243. These results were called by telephone at the time of interpretation on 07/06/2016 at 1:02 pm to Dr. JElnora Morrison, who verbally acknowledged these results. Electronically Signed   By: AMarkus DaftM.D.   On: 07/06/2016 13:03    Review of Systems   Constitutional: Positive for malaise/fatigue.  HENT: Negative.   Eyes: Negative.   Respiratory: Negative.   Cardiovascular: Negative.   Gastrointestinal: Positive for abdominal pain. Negative for diarrhea and vomiting.  Genitourinary: Negative.   Musculoskeletal: Negative.   Skin: Negative.   Neurological: Negative.   Endo/Heme/Allergies: Negative.   Psychiatric/Behavioral: Negative.     Blood pressure 111/80, pulse 69, temperature 97.7 F (36.5 C), resp. rate 23, height '5\' 10"'$  (1.778 m), weight 170 lb (77.1 kg), SpO2 100 %. Physical Exam  Vitals reviewed. Constitutional: He is oriented to person, place, and time. He appears well-developed and well-nourished.  HENT:  Head: Normocephalic and atraumatic.  Neck: Normal range of motion. Neck supple.  Cardiovascular: Normal rate,  regular rhythm and normal heart sounds.   Respiratory: Effort normal and breath sounds normal.  GI: Soft. He exhibits no distension. There is tenderness. There is no rebound.  Tender in the right lower quadrant to palpation. No rigidity noted.  Neurological: He is alert and oriented to person, place, and time.  Skin: Skin is warm and dry.     Assessment/Plan Impression: Acute appendicitis Plan: Patient be taken to the operating room for laparoscopic appendectomy. Risks and benefits of the procedure including bleeding, infection, and the possibility of an open procedure were fully explained to the patient, who gave informed consent.  Aviva Signs, MD 07/06/2016, 3:03 PM

## 2016-07-06 NOTE — Transfer of Care (Signed)
Immediate Anesthesia Transfer of Care Note  Patient: Brett Pace  Procedure(s) Performed: Procedure(s): APPENDECTOMY LAPAROSCOPIC (N/A)  Patient Location: PACU  Anesthesia Type:General  Level of Consciousness: awake, oriented and patient cooperative  Airway & Oxygen Therapy: Patient Spontanous Breathing and Patient connected to face mask oxygen  Post-op Assessment: Report given to RN and Post -op Vital signs reviewed and stable  Post vital signs: Reviewed and stable  Last Vitals:  Vitals:   07/06/16 1500 07/06/16 1645  BP: 133/94 107/78  Pulse:  95  Resp: 14 19  Temp:  (P) 37 C    Last Pain:  Vitals:   07/06/16 1003  TempSrc:   PainSc: 2          Complications: No apparent anesthesia complications

## 2016-07-06 NOTE — ED Notes (Signed)
Pt left with OR team for procedure.

## 2016-07-06 NOTE — ED Triage Notes (Signed)
Pt comes in with pressure in abdomen that started after he ate yesterday. He has increased amount of gas. Pt denies any vomiting. He had one episode of diarrhea yesterday.

## 2016-07-06 NOTE — Anesthesia Preprocedure Evaluation (Signed)
Anesthesia Evaluation  Patient identified by MRN, date of birth, ID band Patient awake    Reviewed: Allergy & Precautions, NPO status , Patient's Chart, lab work & pertinent test results  Airway Mallampati: II  TM Distance: >3 FB Neck ROM: Full    Dental  (+) Teeth Intact   Pulmonary asthma ,  Allergy induced asthma; patient reports no problem in 10 years   breath sounds clear to auscultation       Cardiovascular Exercise Tolerance: Good negative cardio ROS   Rhythm:Regular     Neuro/Psych negative neurological ROS  negative psych ROS   GI/Hepatic Neg liver ROS, GERD  Controlled,  Endo/Other  negative endocrine ROS  Renal/GU negative Renal ROS     Musculoskeletal negative musculoskeletal ROS (+)   Abdominal   Peds  Hematology negative hematology ROS (+)   Anesthesia Other Findings   Reproductive/Obstetrics                             Anesthesia Physical Anesthesia Plan  ASA: I and emergent  Anesthesia Plan: General   Post-op Pain Management:    Induction: Intravenous, Rapid sequence and Cricoid pressure planned  Airway Management Planned: Oral ETT  Additional Equipment:   Intra-op Plan:   Post-operative Plan: Extubation in OR  Informed Consent:   Dental advisory given  Plan Discussed with: Anesthesiologist and Surgeon  Anesthesia Plan Comments:         Anesthesia Quick Evaluation

## 2016-07-07 LAB — BASIC METABOLIC PANEL
ANION GAP: 5 (ref 5–15)
BUN: 11 mg/dL (ref 6–20)
CHLORIDE: 109 mmol/L (ref 101–111)
CO2: 25 mmol/L (ref 22–32)
Calcium: 7.7 mg/dL — ABNORMAL LOW (ref 8.9–10.3)
Creatinine, Ser: 0.8 mg/dL (ref 0.61–1.24)
GFR calc Af Amer: >60 mL/min (ref >60)
Glucose, Bld: 96 mg/dL (ref 65–99)
Potassium: 3.4 mmol/L — ABNORMAL LOW (ref 3.5–5.1)
SODIUM: 139 mmol/L (ref 135–145)

## 2016-07-07 LAB — CBC
HCT: 38.8 % — ABNORMAL LOW (ref 39.0–52.0)
HEMOGLOBIN: 13.2 g/dL (ref 13.0–17.0)
MCH: 30.2 pg (ref 26.0–34.0)
MCHC: 34 g/dL (ref 30.0–36.0)
MCV: 88.8 fL (ref 78.0–100.0)
PLATELETS: 193 10*3/uL (ref 150–400)
RBC: 4.37 MIL/uL (ref 4.22–5.81)
RDW: 12.6 % (ref 11.5–15.5)
WBC: 11 10*3/uL — AB (ref 4.0–10.5)

## 2016-07-07 MED ORDER — HYDROCODONE-ACETAMINOPHEN 5-325 MG PO TABS: 1.0000 | ORAL_TABLET | ORAL | 0 refills | Status: DC | PRN

## 2016-07-07 NOTE — Addendum Note (Signed)
Addendum  created 07/07/16 1047 by Earleen NewportAmy A Geanine Vandekamp, CRNA   Sign clinical note

## 2016-07-07 NOTE — Progress Notes (Signed)
Patient with orders to be discharge home. Discharge instructions given, patient and spouse verbalized understanding. Prescription given. Patient stable. Patient left in private vehicle with spouse.

## 2016-07-07 NOTE — Anesthesia Postprocedure Evaluation (Signed)
Anesthesia Post Note  Patient: Brett Pace  Procedure(s) Performed: Procedure(s) (LRB): APPENDECTOMY LAPAROSCOPIC (N/A)  Patient location during evaluation: Nursing Unit Anesthesia Type: General Level of consciousness: awake and alert and oriented Pain management: pain level controlled Vital Signs Assessment: post-procedure vital signs reviewed and stable Respiratory status: spontaneous breathing Cardiovascular status: stable Postop Assessment: no signs of nausea or vomiting Anesthetic complications: no     Last Vitals:  Vitals:   07/07/16 0120 07/07/16 0546  BP: 102/62 99/61  Pulse: (!) 56 (!) 54  Resp: 14 16  Temp: 36.8 C 36.9 C    Last Pain:  Vitals:   07/07/16 0903  TempSrc:   PainSc: 0-No pain                 Artemio Dobie A

## 2016-07-07 NOTE — Discharge Summary (Signed)
Physician Discharge Summary  Patient ID: Brett DoryHenry J Kindler MRN: [id number] DOB/AGE: Sep 06, 1955 61 y.o.  Admit date: 07/06/2016 Discharge date: 07/07/2016  Admission Diagnoses:Acute appendicitis  Discharge Diagnoses: Same Active Problems:   Acute appendicitis with localized peritonitis   Acute appendicitis   Discharged Condition: good  Hospital Course: Patient is a 61 year old white male who presented to the emergency room with a 24-hour history of worsening right lower quadrant abdominal pain. CT scan of the abdomen revealed acute appendicitis. He was taken to the operating room on 07/06/2016 and underwent an uneventful laparoscopic appendectomy. Tolerated the procedure well. His postoperative course has been unremarkable. His diet was advanced without difficulty. The patient is being discharged home on postoperative day 1 in good and improving condition.  Treatments: surgery: Laparoscopic appendectomy on 07/06/2016  Discharge Exam: Blood pressure 99/61, pulse (!) 54, temperature 98.4 F (36.9 C), temperature source Oral, resp. rate 16, height 5\' 10"  (1.778 m), weight 170 lb (77.1 kg), SpO2 99 %. General appearance: alert, cooperative and no distress Resp: clear to auscultation bilaterally Cardio: regular rate and rhythm, S1, S2 normal, no murmur, click, rub or gallop GI: Soft, dressings dry and intact.  Disposition: Home  Discharge Instructions    Diet - low sodium heart healthy    Complete by:  As directed    Increase activity slowly    Complete by:  As directed      Allergies as of 07/07/2016   No Known Allergies     Medication List    TAKE these medications   HYDROcodone-acetaminophen 5-325 MG tablet Commonly known as:  NORCO Take 1 tablet by mouth every 4 (four) hours as needed for moderate pain.      Follow-up Information    Franky MachoMark Madalyne Husk, MD. Schedule an appointment as soon as possible for a visit on 07/16/2016.   Specialty:  General Surgery Contact  information: [address] [place] [id number] [phone]           Signed: Franky MachoMark Eric Morganti 07/07/2016, 10:15 AM

## 2016-07-07 NOTE — Discharge Instructions (Signed)
Laparoscopic Appendectomy, Adult, Care After °Refer to this sheet in the next few weeks. These instructions provide you with information about caring for yourself after your procedure. Your health care provider may also give you more specific instructions. Your treatment has been planned according to current medical practices, but problems sometimes occur. Call your health care provider if you have any problems or questions after your procedure. °What can I expect after the procedure? °After the procedure, it is common to have: °· A decrease in your energy level. °· Mild pain in the area where the surgical cuts (incisions) were made. °· Constipation. This can be caused by pain medicine and a decrease in your activity. °Follow these instructions at home: °Medicines  °· Take over-the-counter and prescription medicines only as told by your health care provider. °· Do not drive for 24 hours if you received a sedative. °· Do not drive or operate heavy machinery while taking prescription pain medicine. °· If you were prescribed an antibiotic medicine, take it as told by your health care provider. Do not stop taking the antibiotic even if you start to feel better. °Activity  °· For 3 weeks or as long as told by your health care provider: °¨ Do not lift anything that is heavier than 10 pounds (4.5 kg). °¨ Do not play contact sports. °· Gradually return to your normal activities. Ask your health care provider what activities are safe for you. °Bathing  °· Keep your incisions clean and dry. Clean them as often as told by your health care provider: °¨ Gently wash the incisions with soap and water. °¨ Rinse the incisions with water to remove all soap. °¨ Pat the incisions dry with a clean towel. Do not rub the incisions. °· You may take showers after 48 hours. °· Do not take baths, swim, or use hot tubs for 2 weeks or as told by your health care provider. °Incision care  °· Follow instructions from your healthcare provider  about how to take care of your incisions. Make sure you: °¨ Wash your hands with soap and water before you change your bandage (dressing). If soap and water are not available, use hand sanitizer. °¨ Change your dressing as told by your health care provider. °¨ Leave stitches (sutures), skin glue, or adhesive strips in place. These skin closures may need to stay in place for 2 weeks or longer. If adhesive strip edges start to loosen and curl up, you may trim the loose edges. Do not remove adhesive strips completely unless your health care provider tells you to do that. °· Check your incision areas every day for signs of infection. Check for: °¨ More redness, swelling, or pain. °¨ More fluid or blood. °¨ Warmth. °¨ Pus or a bad smell. °Other Instructions  °· If you were sent home with a drain, follow instructions from your health care provider about how to care for the drain and how to empty it. °· Take deep breaths. This helps to prevent your lungs from becoming inflamed. °· To relieve and prevent constipation: °¨ Drink plenty of fluids. °¨ Eat plenty of fruits and vegetables. °· Keep all follow-up visits as told by your health care provider. This is important. °Contact a health care provider if: °· You have more redness, swelling, or pain around an incision. °· You have more fluid or blood coming from an incision. °· Your incision feels warm to the touch. °· You have pus or a bad smell coming from an incision or   dressing. °· Your incision edges break open after your sutures have been removed. °· You have increasing pain in your shoulders. °· You feel dizzy or you faint. °· You develop shortness of breath. °· You keep feeling nauseous or vomiting. °· You have diarrhea or you cannot control your bowel functions. °· You lose your appetite. °· You develop swelling or pain in your legs. °Get help right away if: °· You have a fever. °· You develop a rash. °· You have difficulty breathing. °· You have sharp pains in your  chest. °This information is not intended to replace advice given to you by your health care provider. Make sure you discuss any questions you have with your health care provider. °Document Released: 04/22/2005 Document Revised: 09/22/2015 Document Reviewed: 10/10/2014 °Elsevier Interactive Patient Education © 2017 [employer] ° °

## 2016-07-08 ENCOUNTER — Encounter (HOSPITAL_COMMUNITY): Payer: Self-pay | Admitting: General Surgery

## 2016-07-16 ENCOUNTER — Ambulatory Visit (INDEPENDENT_AMBULATORY_CARE_PROVIDER_SITE_OTHER): Payer: Self-pay | Admitting: General Surgery

## 2016-07-16 ENCOUNTER — Encounter: Payer: Self-pay | Admitting: General Surgery

## 2016-07-16 VITALS — BP 116/68 | HR 67 | Temp 97.8°F | Resp 20 | Ht 70.0 in | Wt 168.0 lb

## 2016-07-16 DIAGNOSIS — Z09 Encounter for follow-up examination after completed treatment for conditions other than malignant neoplasm: Secondary | ICD-10-CM

## 2016-07-16 NOTE — Progress Notes (Signed)
Subjective:     Brett Pace  Status post laparoscopic appendectomy. Doing well. No complaints. Objective:    BP 116/68   Pulse 67   Temp 97.8 F (36.6 C)   Resp 20   Ht 5\' 10"  (1.778 m)   Wt 168 lb (76.2 kg)   BMI 24.11 kg/m   General:  alert, cooperative and no distress  Abdomen soft. Incisions healing well. Staples removed, Steri-Strips applied.     Assessment:    Doing well postoperatively.    Plan:  May return to normal activity.

## 2016-10-29 ENCOUNTER — Other Ambulatory Visit: Payer: Self-pay | Admitting: Family Medicine

## 2016-10-29 ENCOUNTER — Ambulatory Visit
Admission: RE | Admit: 2016-10-29 | Discharge: 2016-10-29 | Disposition: A | Discharge status: Home / Self Care | Payer: No Typology Code available for payment source | Source: Ambulatory Visit | Attending: Family Medicine | Admitting: Family Medicine

## 2016-10-29 DIAGNOSIS — M545 Low back pain: Secondary | ICD-10-CM

## 2016-10-30 ENCOUNTER — Encounter: Payer: Self-pay | Admitting: Internal Medicine

## 2016-11-21 ENCOUNTER — Ambulatory Visit (AMBULATORY_SURGERY_CENTER): Payer: Self-pay

## 2016-11-21 VITALS — Ht 71.0 in | Wt 162.8 lb

## 2016-11-21 DIAGNOSIS — Z1211 Encounter for screening for malignant neoplasm of colon: Secondary | ICD-10-CM

## 2016-11-21 NOTE — Progress Notes (Signed)
Denies allergies to eggs or soy products. Denies complication of anesthesia or sedation. Denies use of weight loss medication. Denies use of O2.   Emmi instructions declined.  

## 2016-12-05 ENCOUNTER — Encounter: Payer: Self-pay | Admitting: Internal Medicine

## 2016-12-05 ENCOUNTER — Ambulatory Visit (AMBULATORY_SURGERY_CENTER): Payer: Self-pay | Admitting: Internal Medicine

## 2016-12-05 VITALS — BP 118/76 | HR 57 | Temp 97.7°F | Resp 12 | Ht 71.0 in | Wt 162.0 lb

## 2016-12-05 DIAGNOSIS — Z1212 Encounter for screening for malignant neoplasm of rectum: Secondary | ICD-10-CM

## 2016-12-05 DIAGNOSIS — Z1211 Encounter for screening for malignant neoplasm of colon: Secondary | ICD-10-CM

## 2016-12-05 MED ORDER — SODIUM CHLORIDE 0.9 % IV SOLN: 500.0000 mL | INTRAVENOUS | Status: AC

## 2016-12-05 NOTE — Op Note (Addendum)
[facility] Patient Name: Brett DadaHenry Parenteau Procedure Date: 12/05/2016 3:56 PM MRN: [id number] Endoscopist: Iva Booparl E Marrietta Thunder , MD Age: 61 Referring MD:  Date of Birth: 01-Sep-1955 Gender: Male Account #: [id number] Procedure:                Colonoscopy Indications:              Screening for colorectal malignant neoplasm Medicines:                Propofol per Anesthesia, Monitored Anesthesia Care Procedure:                Pre-Anesthesia Assessment:                           - Prior to the procedure, a History and Physical                            was performed, and patient medications and                            allergies were reviewed. The patient's tolerance of                            previous anesthesia was also reviewed. The risks                            and benefits of the procedure and the sedation                            options and risks were discussed with the patient.                            All questions were answered, and informed consent                            was obtained. Prior Anticoagulants: The patient has                            taken no previous anticoagulant or antiplatelet                            agents. ASA Grade Assessment: II - A patient with                            mild systemic disease. After reviewing the risks                            and benefits, the patient was deemed in                            satisfactory condition to undergo the procedure.                           After obtaining informed consent, the colonoscope  was passed under direct vision. Throughout the                            procedure, the patient's blood pressure, pulse, and                            oxygen saturations were monitored continuously. The                            Model CF-HQ190L [id number]) scope was introduced                            through the anus and advanced to the the cecum,       identified by appendiceal orifice and ileocecal                            valve. The colonoscopy was performed without                            difficulty. The patient tolerated the procedure                            well. The quality of the bowel preparation was                            excellent. The ileocecal valve, appendiceal                            orifice, and rectum were photographed. The bowel                            preparation used was Miralax. Scope In: 4:08:56 PM Scope Out: 4:20:16 PM Scope Withdrawal Time: 0 hours 8 minutes 58 seconds  Total Procedure Duration: 0 hours 11 minutes 20 seconds  Findings:                 The perianal exam findings include hemorrhoids.                           The digital rectal exam was normal. Pertinent                            negatives include normal prostate (size, shape, and                            consistency).                           External and internal hemorrhoids were found during                            retroflexion.                           The exam was otherwise without abnormality on  direct and retroflexion views. Complications:            No immediate complications. Estimated Blood Loss:     Estimated blood loss: none. Impression:               - Hemorrhoids found on perianal exam.                           - External and internal hemorrhoids.                           - The examination was otherwise normal on direct                            and retroflexion views.                           - No specimens collected. Recommendation:           - Patient has a contact number available for                            emergencies. The signs and symptoms of potential                            delayed complications were discussed with the                            patient. Return to normal activities tomorrow.                            Written discharge instructions were  provided to the                            patient.                           - Resume previous diet.                           - Continue present medications.                           - Repeat colonoscopy in 10 years for screening                            purposes.                           - In recovery wife and patient told me he is having                            diarrhea - urgent defecation w/o pattern - loose -                            sometimes after lifting heavy items.  Will try VSL # [id number] [phone] U daily x 4 weeks and he will                            call back.                           The sxs after lifting make me wonder about possible                            pelvic floor issue (has hemorrhoids also) but that                            shouldn't make stools watery/loose.                           Consider celiac testing or empiric Xifaxan                            (samples) or FODMAPS diet Iva Booparl E Nalina Yeatman, MD 12/05/2016 4:24:15 PM This report has been signed electronically.

## 2016-12-05 NOTE — Patient Instructions (Addendum)
   No polyps or cancer seen. You still have some enlarged hemorrhoids as I suspect you know.  Next routine colonoscopy or other screening test in 10 years - 2028  I appreciate the opportunity to care for you. Iva Booparl E. Yanette Tripoli, MD, [facility]   Discharge instructions given. Handout on hemorrhoids. Resume previous medications. YOU HAD AN ENDOSCOPIC PROCEDURE TODAY AT THE [facility]:   Refer to the procedure report that was given to you for any specific questions about what was found during the examination.  If the procedure report does not answer your questions, please call your gastroenterologist to clarify.  If you requested that your care partner not be given the details of your procedure findings, then the procedure report has been included in a sealed envelope for you to review at your convenience later.  YOU SHOULD EXPECT: Some feelings of bloating in the abdomen. Passage of more gas than usual.  Walking can help get rid of the air that was put into your GI tract during the procedure and reduce the bloating. If you had a lower endoscopy (such as a colonoscopy or flexible sigmoidoscopy) you may notice spotting of blood in your stool or on the toilet paper. If you underwent a bowel prep for your procedure, you may not have a normal bowel movement for a few days.  Please Note:  You might notice some irritation and congestion in your nose or some drainage.  This is from the oxygen used during your procedure.  There is no need for concern and it should clear up in a day or so.  SYMPTOMS TO REPORT IMMEDIATELY:   Following lower endoscopy (colonoscopy or flexible sigmoidoscopy):  Excessive amounts of blood in the stool  Significant tenderness or worsening of abdominal pains  Swelling of the abdomen that is new, acute  Fever of 100F or higher   For urgent or emergent issues, a gastroenterologist can be reached at any hour by calling [phone].   DIET:  We do recommend a  small meal at first, but then you may proceed to your regular diet.  Drink plenty of fluids but you should avoid alcoholic beverages for 24 hours.  ACTIVITY:  You should plan to take it easy for the rest of today and you should NOT DRIVE or use heavy machinery until tomorrow (because of the sedation medicines used during the test).    FOLLOW UP: Our staff will call the number listed on your records the next business day following your procedure to check on you and address any questions or concerns that you may have regarding the information given to you following your procedure. If we do not reach you, we will leave a message.  However, if you are feeling well and you are not experiencing any problems, there is no need to return our call.  We will assume that you have returned to your regular daily activities without incident.  If any biopsies were taken you will be contacted by phone or by letter within the next 1-3 weeks.  Please call us at [phone] if you have not heard about the biopsies in 3 weeks.    SIGNATURES/CONFIDENTIALITY: You and/or your care partner have signed paperwork which will be entered into your electronic medical record.  These signatures attest to the fact that that the information above on your After Visit Summary has been reviewed and is understood.  Full responsibility of the confidentiality of this discharge information lies with you and/or your care-partner.

## 2016-12-05 NOTE — Progress Notes (Signed)
Report to PACU, RN, vss, BBS= Clear.  

## 2016-12-06 ENCOUNTER — Telehealth: Payer: Self-pay

## 2016-12-06 NOTE — Telephone Encounter (Signed)
  Follow up Call-  Call back number 12/05/2016  Post procedure Call Back phone  # [phone]  Permission to leave phone message No  Some recent data might be hidden     Patient questions:  Do you have a fever, pain , or abdominal swelling? No. Pain Score  0 *  Have you tolerated food without any problems? Yes.    Have you been able to return to your normal activities? Yes.    Do you have any questions about your discharge instructions: Diet   No. Medications  No. Follow up visit  No.  Do you have questions or concerns about your Care? No.  Actions: * If pain score is 4 or above: No action needed, pain <4.

## 2017-07-15 ENCOUNTER — Other Ambulatory Visit: Payer: Self-pay | Admitting: Family Medicine

## 2017-07-15 DIAGNOSIS — R911 Solitary pulmonary nodule: Secondary | ICD-10-CM

## 2017-07-28 ENCOUNTER — Other Ambulatory Visit: Payer: Self-pay

## 2017-08-05 ENCOUNTER — Ambulatory Visit
Admission: RE | Admit: 2017-08-05 | Discharge: 2017-08-05 | Disposition: A | Discharge status: Home / Self Care | Payer: No Typology Code available for payment source | Source: Ambulatory Visit | Attending: Family Medicine | Admitting: Family Medicine

## 2017-08-05 DIAGNOSIS — R911 Solitary pulmonary nodule: Secondary | ICD-10-CM

## 2019-11-14 IMAGING — CT CT CHEST W/O CM
1 of 2 series · 14 of 30 positions shown, 18 images · non-contrast
Comparison: Abdomen and pelvis CT from 07/06/2016.

CLINICAL DATA: Pulmonary nodule.

EXAM:
CT CHEST WITHOUT CONTRAST
TECHNIQUE: Multidetector CT imaging of the chest was performed following the
standard protocol without IV contrast.

[Series 2: chest w/(date) · axial · 0.74mm/px · z∈[-387,-89]mm · 14 of 175 slices shown, 18 images]
[im 13/175  mediastinal]
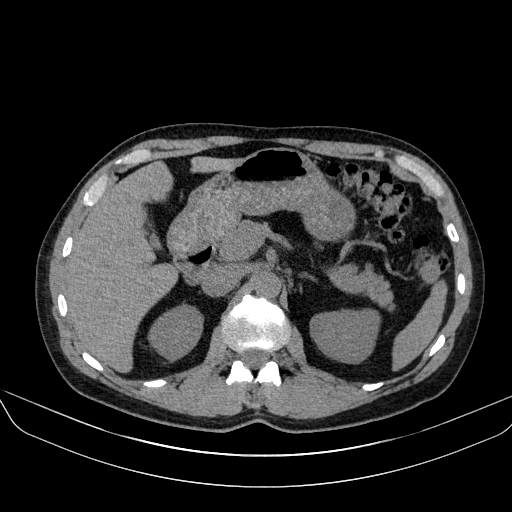
[im 13/175  lung]
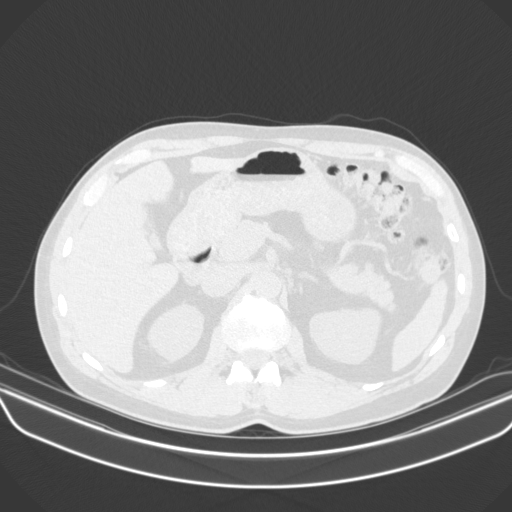
[im 25/175  lung]
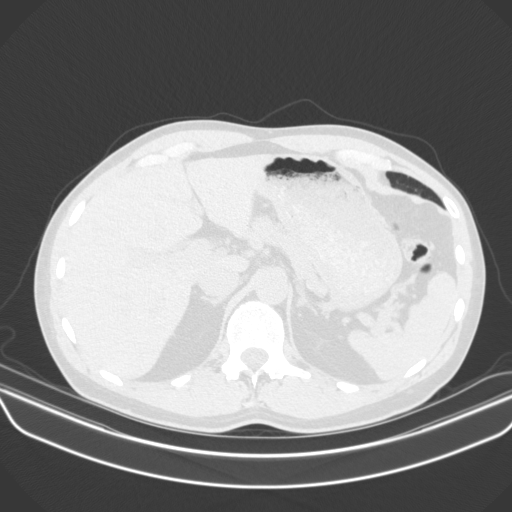
[im 38/175  lung]
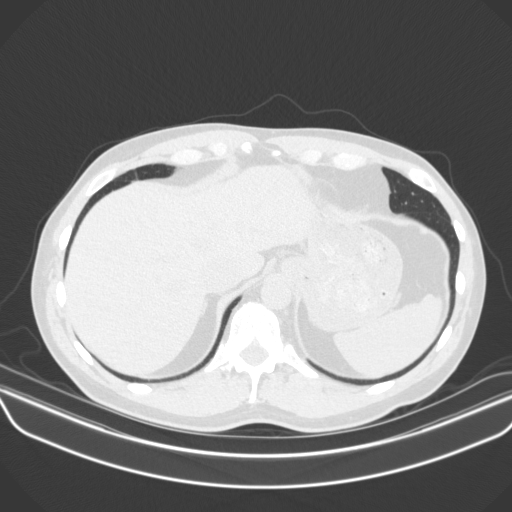
[im 50/175  lung]
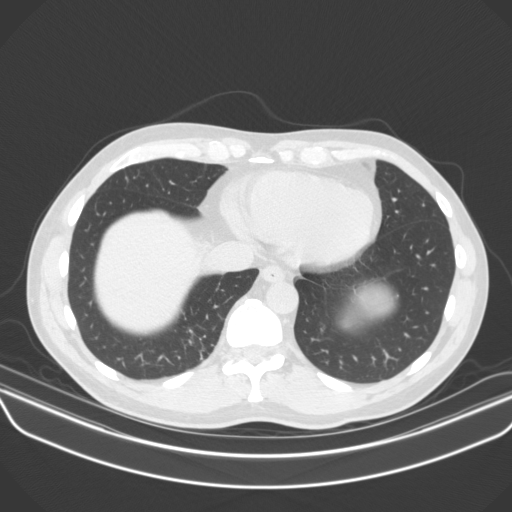
[im 63/175  mediastinal]
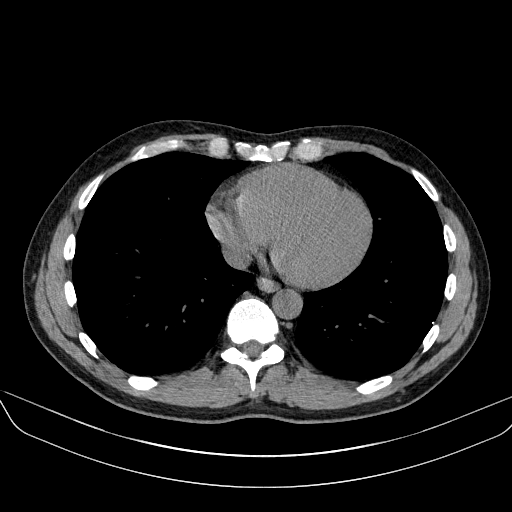
[im 63/175  lung]
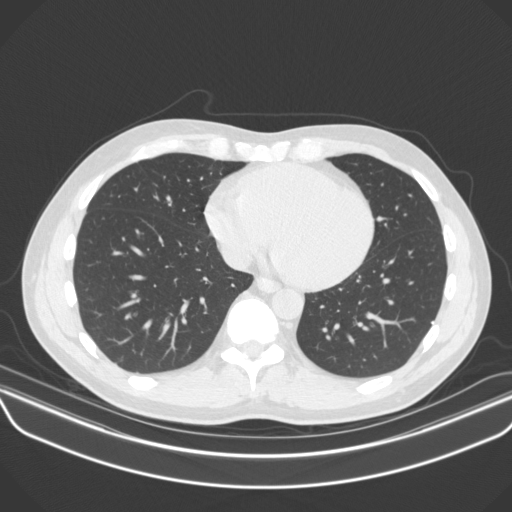
[im 75/175  lung]
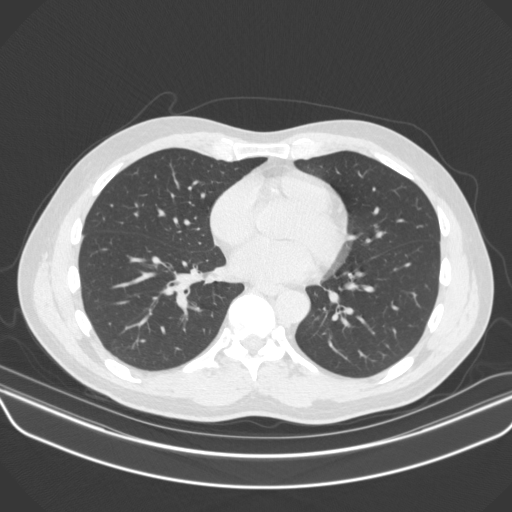
[im 84/175  lung]
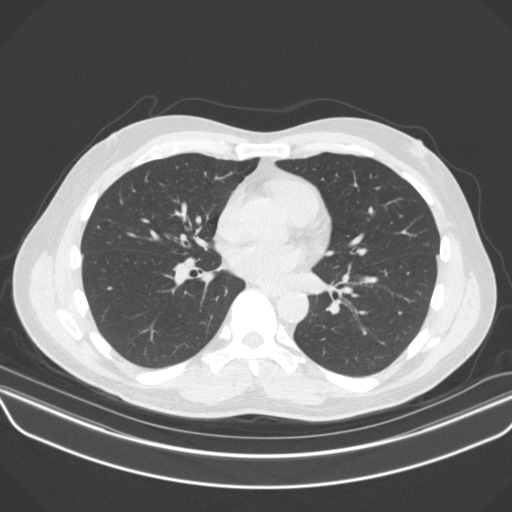
[im 88/175  lung]
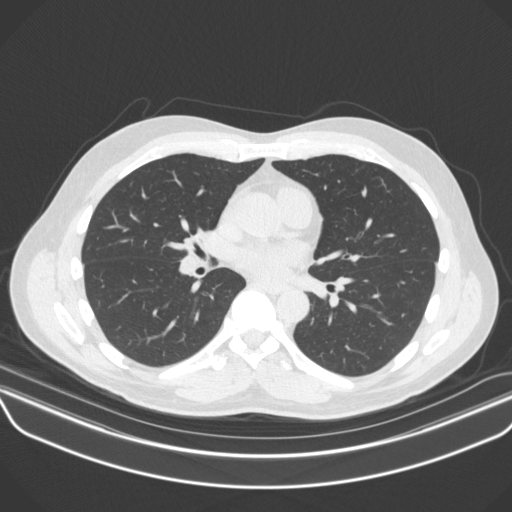
[im 100/175  mediastinal]
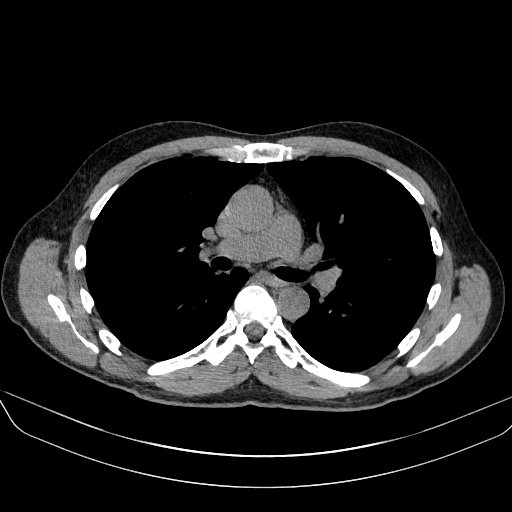
[im 100/175  lung]
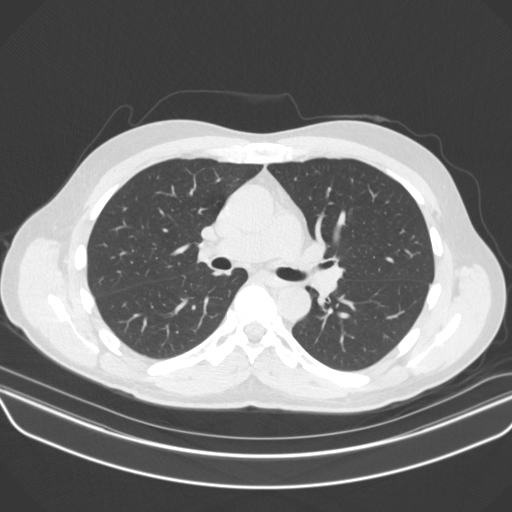
[im 112/175  lung]
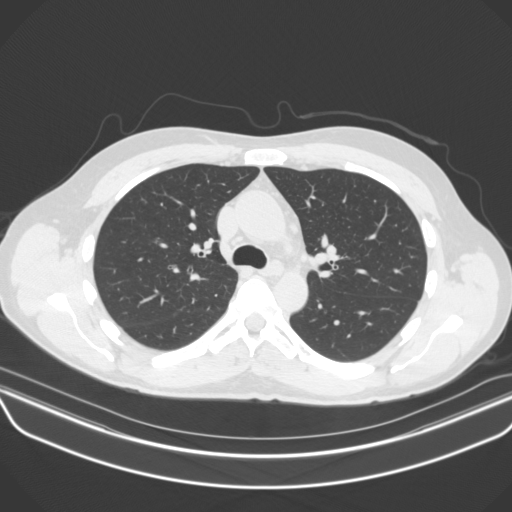
[im 125/175  lung]
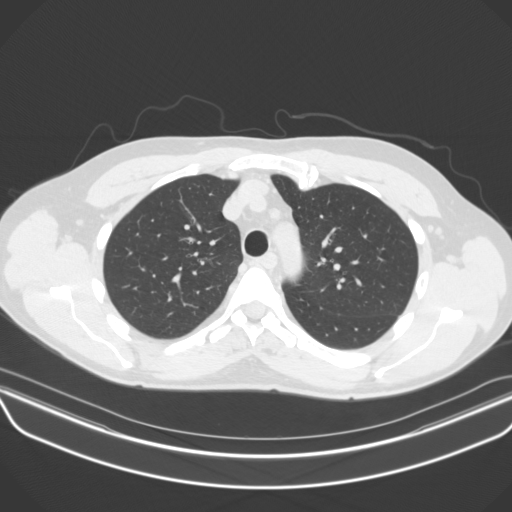
[im 137/175  lung]
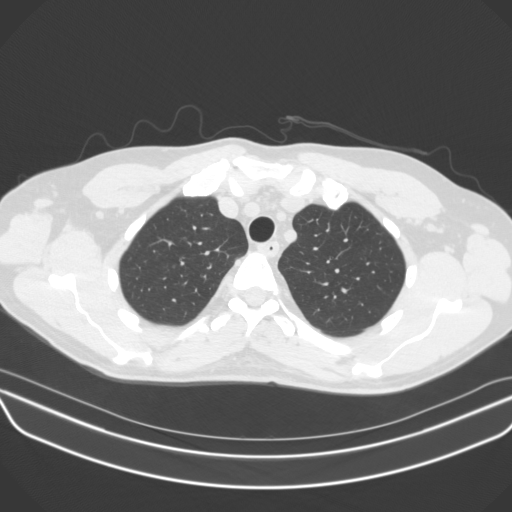
[im 150/175  mediastinal]
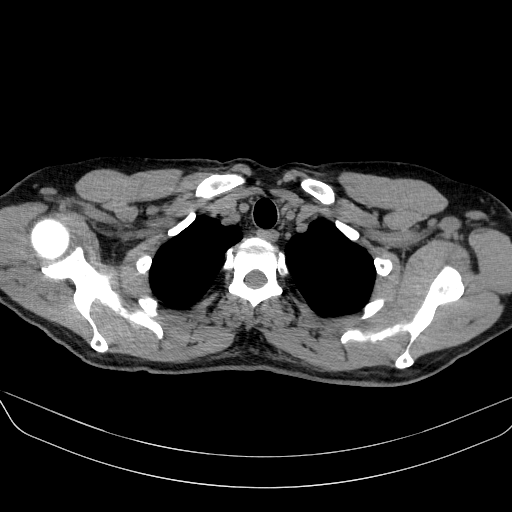
[im 150/175  lung]
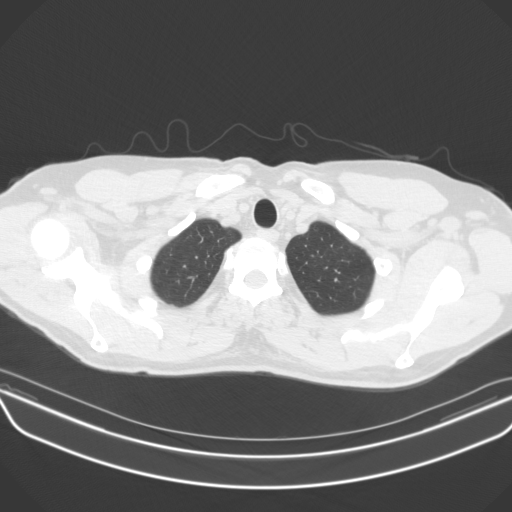
[im 162/175  lung]
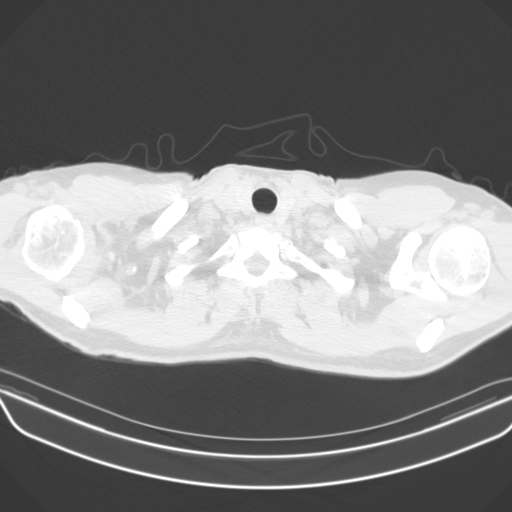

[14 of 30 positions shown; findings below may reference images not displayed]

FINDINGS: Cardiovascular: The heart size is normal. No pericardial effusion.
No thoracic aortic aneurysm.

Mediastinum/Nodes: No mediastinal lymphadenopathy. No evidence for
gross hilar lymphadenopathy although assessment is limited by the
lack of intravenous contrast on today's study. The esophagus has
normal imaging features. There is no axillary lymphadenopathy.

Lungs/Pleura: Tiny left lower lobe subpleural nodule identified on
previous exam is stable and has become calcified in the interval,
consistent with granuloma. No new or suspicious pulmonary nodule or
mass. No focal airspace consolidation. No pulmonary edema or pleural
effusion.

Upper Abdomen: Unremarkable.

Musculoskeletal: Sclerotic lesion in the lateral left fourth rib is
probably a bone island.
IMPRESSION: Tiny subpleural left lower lobe pulmonary nodule identified
previously shows interval dense calcification, consistent with
granuloma. No further imaging follow-up warranted.

## 2020-05-09 ENCOUNTER — Other Ambulatory Visit: Payer: Self-pay | Admitting: Family Medicine

## 2022-09-18 ENCOUNTER — Other Ambulatory Visit: Payer: Self-pay | Admitting: Family Medicine

## 2022-09-18 DIAGNOSIS — M545 Low back pain, unspecified: Secondary | ICD-10-CM

## 2022-10-30 ENCOUNTER — Other Ambulatory Visit: Payer: Self-pay | Admitting: Family Medicine

## 2022-10-30 ENCOUNTER — Ambulatory Visit
Admission: RE | Admit: 2022-10-30 | Discharge: 2022-10-30 | Disposition: A | Discharge status: Home / Self Care | Payer: Medicare Other | Source: Ambulatory Visit | Attending: Family Medicine | Admitting: Family Medicine

## 2022-10-30 DIAGNOSIS — M545 Low back pain, unspecified: Secondary | ICD-10-CM

## 2022-11-04 ENCOUNTER — Other Ambulatory Visit: Payer: Self-pay

## 2022-11-11 ENCOUNTER — Ambulatory Visit
Admission: RE | Admit: 2022-11-11 | Discharge: 2022-11-11 | Disposition: A | Discharge status: Home / Self Care | Payer: Medicare Other | Source: Ambulatory Visit | Attending: Family Medicine | Admitting: Family Medicine

## 2022-11-11 ENCOUNTER — Ambulatory Visit: Admission: RE | Admit: 2022-11-11 | Payer: Medicare Other | Source: Ambulatory Visit

## 2022-11-11 ENCOUNTER — Other Ambulatory Visit: Payer: Self-pay | Admitting: Family Medicine

## 2022-11-11 DIAGNOSIS — M545 Low back pain, unspecified: Secondary | ICD-10-CM

## 2022-11-12 ENCOUNTER — Other Ambulatory Visit: Payer: Self-pay
